# Patient Record
Sex: Male | Born: 1985 | Race: Black or African American | Hispanic: No | Marital: Single | Smoking: Former smoker
Health system: Southern US, Community
[De-identification: ages and names within clinical notes are randomized; demographics above are authoritative.]

## PROBLEM LIST (undated history)

## (undated) DIAGNOSIS — J45909 Unspecified asthma, uncomplicated: Secondary | ICD-10-CM

## (undated) HISTORY — PX: TONSILLECTOMY: SUR1361

## (undated) HISTORY — PX: EXPLORATORY LAPAROTOMY: SUR591

---
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
## 2007-08-22 ENCOUNTER — Inpatient Hospital Stay (HOSPITAL_COMMUNITY): Admission: AC | Admit: 2007-08-22 | Discharge: 2007-08-25 | Payer: Self-pay

## 2009-10-05 ENCOUNTER — Emergency Department (HOSPITAL_COMMUNITY): Admission: EM | Admit: 2009-10-05 | Discharge: 2009-10-05 | Payer: Self-pay | Admitting: Emergency Medicine

## 2009-12-01 ENCOUNTER — Emergency Department (HOSPITAL_COMMUNITY): Admission: EM | Admit: 2009-12-01 | Discharge: 2009-12-01 | Payer: Self-pay | Admitting: Emergency Medicine

## 2010-03-15 ENCOUNTER — Emergency Department (HOSPITAL_COMMUNITY): Admission: EM | Admit: 2010-03-15 | Discharge: 2010-03-15 | Payer: Self-pay | Admitting: Emergency Medicine

## 2010-09-22 ENCOUNTER — Emergency Department (HOSPITAL_COMMUNITY)
Admission: EM | Admit: 2010-09-22 | Discharge: 2010-09-22 | Payer: Self-pay | Source: Home / Self Care | Admitting: Emergency Medicine
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
## 2011-07-08 LAB — CBC
HCT: 40.1
Hemoglobin: 13.5
MCHC: 33.7
MCHC: 34
MCV: 92.3
MCV: 92.4
Platelets: 319
RBC: 4.35
RDW: 13.4
WBC: 11.5 — ABNORMAL HIGH
WBC: 8.8

## 2011-07-08 LAB — BASIC METABOLIC PANEL
BUN: 4 — ABNORMAL LOW
BUN: 9
CO2: 27
Calcium: 8.7
Chloride: 105
GFR calc Af Amer: >60
GFR calc Af Amer: >60
GFR calc non Af Amer: >60
Potassium: 3.6
Sodium: 136
Sodium: 137

## 2011-07-08 LAB — I-STAT 8, (EC8 V) (CONVERTED LAB)
Acid-base deficit: 1
BUN: 13
Chloride: 105
Glucose, Bld: 104 — ABNORMAL HIGH
HCT: 43
Potassium: 2.9 — ABNORMAL LOW

## 2011-07-08 LAB — HEMOGLOBIN AND HEMATOCRIT, BLOOD
HCT: 37 — ABNORMAL LOW
Hemoglobin: 12.5 — ABNORMAL LOW

## 2011-07-08 LAB — TYPE AND SCREEN: Antibody Screen: NEGATIVE

## 2011-07-08 LAB — POCT I-STAT CREATININE: Operator id: 151321

## 2011-07-08 LAB — ABO/RH: ABO/RH(D): A NEG

## 2011-07-08 LAB — PROTIME-INR
INR: 1.2
Prothrombin Time: 15.2

## 2012-04-14 DIAGNOSIS — R197 Diarrhea, unspecified: Secondary | ICD-10-CM | POA: Insufficient documentation

## 2012-04-14 DIAGNOSIS — R112 Nausea with vomiting, unspecified: Secondary | ICD-10-CM | POA: Insufficient documentation

## 2012-04-15 ENCOUNTER — Emergency Department (HOSPITAL_COMMUNITY)
Admission: EM | Admit: 2012-04-15 | Discharge: 2012-04-15 | Disposition: A | Discharge status: Home / Self Care | Payer: Self-pay | Attending: Emergency Medicine | Admitting: Emergency Medicine

## 2012-04-15 ENCOUNTER — Encounter (HOSPITAL_COMMUNITY): Payer: Self-pay | Admitting: *Deleted

## 2012-04-15 DIAGNOSIS — R197 Diarrhea, unspecified: Secondary | ICD-10-CM

## 2012-04-15 LAB — POCT I-STAT, CHEM 8
BUN: 13 mg/dL (ref 6–23)
Calcium, Ion: 1.2 mmol/L (ref 1.12–1.23)
Chloride: 106 mEq/L (ref 96–112)
Creatinine, Ser: 1 mg/dL (ref 0.50–1.35)
Glucose, Bld: 96 mg/dL (ref 70–99)
HCT: 44 % (ref 39.0–52.0)
Hemoglobin: 15 g/dL (ref 13.0–17.0)
Potassium: 4.7 mEq/L (ref 3.5–5.1)
Sodium: 143 mEq/L (ref 135–145)
TCO2: 24 mmol/L (ref 0–100)

## 2012-04-15 MED ORDER — ONDANSETRON HCL 4 MG/2ML IJ SOLN
4.0000 mg | Freq: Once | INTRAMUSCULAR | Status: AC
  Administered 2012-04-15: 4 mg via INTRAVENOUS

## 2012-04-15 MED ORDER — SODIUM CHLORIDE 0.9 % IV BOLUS (SEPSIS)
1000.0000 mL | Freq: Once | INTRAVENOUS | Status: AC
  Administered 2012-04-15: 1000 mL via INTRAVENOUS

## 2012-04-15 MED ORDER — ONDANSETRON HCL 4 MG/2ML IJ SOLN
INTRAMUSCULAR | Status: AC
  Filled 2012-04-15: qty 2

## 2012-04-15 MED ORDER — PROMETHAZINE HCL 25 MG PO TABS: 25.0000 mg | ORAL_TABLET | Freq: Four times a day (QID) | ORAL | Status: DC | PRN

## 2012-04-15 NOTE — ED Notes (Signed)
Patient states that he ate [employer] hot cakes and after wards he started experiencing nausea,vomiting, and chills.
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
## 2012-11-02 ENCOUNTER — Emergency Department (HOSPITAL_COMMUNITY)
Admission: EM | Admit: 2012-11-02 | Discharge: 2012-11-02 | Disposition: A | Discharge status: Home / Self Care | Payer: Self-pay | Attending: Emergency Medicine | Admitting: Emergency Medicine

## 2012-11-02 ENCOUNTER — Encounter (HOSPITAL_COMMUNITY): Payer: Self-pay | Admitting: *Deleted

## 2012-11-02 DIAGNOSIS — R300 Dysuria: Secondary | ICD-10-CM | POA: Insufficient documentation

## 2012-11-02 DIAGNOSIS — R369 Urethral discharge, unspecified: Secondary | ICD-10-CM | POA: Insufficient documentation

## 2012-11-02 LAB — URINALYSIS, ROUTINE W REFLEX MICROSCOPIC
Glucose, UA: NEGATIVE mg/dL
Hgb urine dipstick: NEGATIVE
Ketones, ur: NEGATIVE mg/dL
Nitrite: NEGATIVE
Protein, ur: NEGATIVE mg/dL
Specific Gravity, Urine: 1.029 (ref 1.005–1.030)
Urobilinogen, UA: 1 mg/dL (ref 0.0–1.0)
pH: 7 (ref 5.0–8.0)

## 2012-11-02 LAB — URINE MICROSCOPIC-ADD ON

## 2012-11-02 MED ORDER — AZITHROMYCIN 250 MG PO TABS
1000.0000 mg | ORAL_TABLET | Freq: Once | ORAL | Status: AC
  Administered 2012-11-02: 1000 mg via ORAL
  Filled 2012-11-02: qty 4

## 2012-11-02 MED ORDER — CEFTRIAXONE SODIUM 250 MG IJ SOLR
250.0000 mg | Freq: Once | INTRAMUSCULAR | Status: AC
  Administered 2012-11-02: 250 mg via INTRAMUSCULAR
  Filled 2012-11-02: qty 250

## 2012-11-02 MED ORDER — LIDOCAINE HCL (PF) 1 % IJ SOLN
INTRAMUSCULAR | Status: AC
  Administered 2012-11-02: 1.2 mL
  Filled 2012-11-02: qty 5

## 2012-11-02 NOTE — ED Notes (Signed)
Pt states unable to void for the past couple of days. Pt states that he is having discharge, and painful urination. Pt states no new partners. Pt denies N/V.

## 2012-11-03 LAB — URINE CULTURE
Colony Count: NO GROWTH
Culture: NO GROWTH

## 2012-11-03 NOTE — ED Provider Notes (Signed)
Medical screening examination/treatment/procedure(s) were performed by non-physician practitioner and as supervising physician I was immediately available for consultation/collaboration.  Sunnie Nielsen, MD 11/03/12 [phone]

## 2012-11-03 NOTE — ED Provider Notes (Signed)
History     CSN: [id number]  Arrival date & time 11/02/12  1941   First MD Initiated Contact with Patient 11/02/12 2009      Chief Complaint  Patient presents with  . Dysuria    (Consider location/radiation/quality/duration/timing/severity/associated sxs/prior treatment) HPI History provided by pt.   Pt c/o dysuria for the past day.  Associated w/ urethral discharge.  Denies fever, abdominal/low back/testicular pain, genitalia rash and other urinary sx.  H/o chlamydia and it presented similarly.  Had unprotected sex 3 days ago.  Has never had a UTI.  History reviewed. No pertinent past medical history.  History reviewed. No pertinent past surgical history.  History reviewed. No pertinent family history.  History  Substance Use Topics  . Smoking status: Passive Smoke Exposure - Never Smoker    Types: Cigarettes  . Smokeless tobacco: Not on file  . Alcohol Use: No      Review of Systems  All other systems reviewed and are negative.    Allergies  Review of patient's allergies indicates no known allergies.  Home Medications   Current Outpatient Rx  Name  Route  Sig  Dispense  Refill  . PROMETHAZINE HCL 25 MG PO TABS   Oral   Take 1 tablet (25 mg total) by mouth every 6 (six) hours as needed for nausea.   30 tablet   0     BP 113/64  Pulse 51  Temp 98.4 F (36.9 C) (Oral)  Resp 16  SpO2 99%  Physical Exam  Nursing note and vitals reviewed. Constitutional: He is oriented to person, place, and time. He appears well-developed and well-nourished. No distress.  HENT:  Head: Normocephalic and atraumatic.  Eyes:       Normal appearance  Neck: Normal range of motion.  Cardiovascular: Normal rate and regular rhythm.   Pulmonary/Chest: Effort normal and breath sounds normal. No respiratory distress.  Abdominal: Soft. Bowel sounds are normal. He exhibits no distension and no mass. There is no tenderness. There is no rebound and no guarding.  Genitourinary:   No CVA tenderness.  No genitalia rash.  Thin, white penile discharge.  Testicles descended bilaterally.  No masses.  No tenderness.   No inguinal lymphadenopathy.  Musculoskeletal: Normal range of motion.  Neurological: He is alert and oriented to person, place, and time.  Skin: Skin is warm and dry. No rash noted.  Psychiatric: He has a normal mood and affect. His behavior is normal.    ED Course  Procedures (including critical care time)  Labs Reviewed  URINALYSIS, ROUTINE W REFLEX MICROSCOPIC - Abnormal; Notable for the following:    APPearance CLOUDY (*)     Leukocytes, UA LARGE (*)     All other components within normal limits  URINE MICROSCOPIC-ADD ON  URINE CULTURE   No results found.   1. Urethral discharge       MDM  26yo M presents w/ urethral discharge and dysuria since yesterday.  Screened for GG/Chlam and pt received both rocephin and zithromax.  Doubt cystitis because no other urinary sx nor prior history of UTI.  Recommended f/u with [facility] clinic and Return precautions discussed.         Otilio Miu, PA-C 11/03/12 [phone]

## 2013-07-21 ENCOUNTER — Ambulatory Visit (HOSPITAL_COMMUNITY)
Admission: AD | Admit: 2013-07-21 | Discharge: 2013-07-21 | DRG: 134 | Disposition: A | Discharge status: Home / Self Care | Payer: Self-pay | Source: Ambulatory Visit | Attending: Otolaryngology | Admitting: Otolaryngology

## 2013-07-21 ENCOUNTER — Encounter (HOSPITAL_COMMUNITY): Payer: Self-pay | Admitting: Anesthesiology

## 2013-07-21 ENCOUNTER — Encounter (HOSPITAL_COMMUNITY): Payer: Self-pay | Admitting: Emergency Medicine

## 2013-07-21 ENCOUNTER — Inpatient Hospital Stay (HOSPITAL_COMMUNITY): Payer: Self-pay | Admitting: Anesthesiology

## 2013-07-21 ENCOUNTER — Encounter (HOSPITAL_COMMUNITY)
Admission: AD | Disposition: A | Discharge status: Home / Self Care | Payer: Self-pay | Source: Ambulatory Visit | Attending: Otolaryngology

## 2013-07-21 ENCOUNTER — Emergency Department (HOSPITAL_COMMUNITY): Payer: Self-pay

## 2013-07-21 ENCOUNTER — Emergency Department (HOSPITAL_COMMUNITY)
Admission: EM | Admit: 2013-07-21 | Discharge: 2013-07-21 | Disposition: A | Discharge status: Home / Self Care | Payer: Self-pay | Attending: Emergency Medicine | Admitting: Emergency Medicine

## 2013-07-21 DIAGNOSIS — J020 Streptococcal pharyngitis: Secondary | ICD-10-CM | POA: Insufficient documentation

## 2013-07-21 DIAGNOSIS — J329 Chronic sinusitis, unspecified: Secondary | ICD-10-CM | POA: Diagnosis present

## 2013-07-21 DIAGNOSIS — J36 Peritonsillar abscess: Secondary | ICD-10-CM | POA: Diagnosis present

## 2013-07-21 DIAGNOSIS — J3503 Chronic tonsillitis and adenoiditis: Secondary | ICD-10-CM | POA: Diagnosis present

## 2013-07-21 HISTORY — PX: TONSILLECTOMY: SHX5217

## 2013-07-21 LAB — BASIC METABOLIC PANEL
CO2: 25 mEq/L (ref 19–32)
Chloride: 98 mEq/L (ref 96–112)
Creatinine, Ser: 1.02 mg/dL (ref 0.50–1.35)
Glucose, Bld: 96 mg/dL (ref 70–99)

## 2013-07-21 LAB — CBC WITH DIFFERENTIAL/PLATELET
Basophils Absolute: 0 10*3/uL (ref 0.0–0.1)
Eosinophils Relative: 1 % (ref 0–5)
HCT: 43.4 % (ref 39.0–52.0)
Hemoglobin: 14.9 g/dL (ref 13.0–17.0)
Lymphocytes Relative: 11 % — ABNORMAL LOW (ref 12–46)
Lymphs Abs: 1.4 10*3/uL (ref 0.7–4.0)
MCV: 91.4 fL (ref 78.0–100.0)
Monocytes Absolute: 1.3 10*3/uL — ABNORMAL HIGH (ref 0.1–1.0)
Neutro Abs: 10.5 10*3/uL — ABNORMAL HIGH (ref 1.7–7.7)
RBC: 4.75 MIL/uL (ref 4.22–5.81)
RDW: 13.2 % (ref 11.5–15.5)
WBC: 13.4 10*3/uL — ABNORMAL HIGH (ref 4.0–10.5)

## 2013-07-21 LAB — RAPID STREP SCREEN (MED CTR MEBANE ONLY): Streptococcus, Group A Screen (Direct): POSITIVE — AB

## 2013-07-21 SURGERY — TONSILLECTOMY
Anesthesia: General | Site: Throat | Laterality: Bilateral | Wound class: Dirty or Infected

## 2013-07-21 MED ORDER — MIDAZOLAM HCL 5 MG/5ML IJ SOLN
INTRAMUSCULAR | Status: DC | PRN
  Administered 2013-07-21: 1 mg via INTRAVENOUS

## 2013-07-21 MED ORDER — MORPHINE SULFATE 2 MG/ML IJ SOLN
2.0000 mg | Freq: Once | INTRAMUSCULAR | Status: AC
  Administered 2013-07-21: 2 mg via INTRAVENOUS
  Filled 2013-07-21: qty 1

## 2013-07-21 MED ORDER — GLYCOPYRROLATE 0.2 MG/ML IJ SOLN
INTRAMUSCULAR | Status: DC | PRN
  Administered 2013-07-21: 0.2 mg via INTRAVENOUS
  Administered 2013-07-21: .6 mg via INTRAVENOUS

## 2013-07-21 MED ORDER — ARTIFICIAL TEARS OP OINT
TOPICAL_OINTMENT | OPHTHALMIC | Status: DC | PRN
  Administered 2013-07-21: 1 via OPHTHALMIC

## 2013-07-21 MED ORDER — PROPOFOL 10 MG/ML IV BOLUS
INTRAVENOUS | Status: DC | PRN
  Administered 2013-07-21: 200 mg via INTRAVENOUS

## 2013-07-21 MED ORDER — SUCCINYLCHOLINE CHLORIDE 20 MG/ML IJ SOLN
INTRAMUSCULAR | Status: DC | PRN
  Administered 2013-07-21: 100 mg via INTRAVENOUS

## 2013-07-21 MED ORDER — ONDANSETRON HCL 4 MG/2ML IJ SOLN
INTRAMUSCULAR | Status: DC | PRN
  Administered 2013-07-21: 4 mg via INTRAVENOUS

## 2013-07-21 MED ORDER — OXYMETAZOLINE HCL 0.05 % NA SOLN
NASAL | Status: AC
  Filled 2013-07-21: qty 15

## 2013-07-21 MED ORDER — IOHEXOL 300 MG/ML  SOLN
75.0000 mL | Freq: Once | INTRAMUSCULAR | Status: AC | PRN
  Administered 2013-07-21: 75 mL via INTRAVENOUS

## 2013-07-21 MED ORDER — LIDOCAINE HCL (CARDIAC) 20 MG/ML IV SOLN
INTRAVENOUS | Status: DC | PRN
  Administered 2013-07-21: 80 mg via INTRAVENOUS

## 2013-07-21 MED ORDER — DEXAMETHASONE SODIUM PHOSPHATE 4 MG/ML IJ SOLN
INTRAMUSCULAR | Status: DC | PRN
  Administered 2013-07-21: 10 mg via INTRAVENOUS

## 2013-07-21 MED ORDER — CLINDAMYCIN PHOSPHATE 600 MG/50ML IV SOLN
600.0000 mg | Freq: Once | INTRAVENOUS | Status: AC
  Administered 2013-07-21: 600 mg via INTRAVENOUS
  Filled 2013-07-21: qty 50

## 2013-07-21 MED ORDER — FENTANYL CITRATE 0.05 MG/ML IJ SOLN
INTRAMUSCULAR | Status: DC | PRN
  Administered 2013-07-21: 100 ug via INTRAVENOUS

## 2013-07-21 MED ORDER — LACTATED RINGERS IV SOLN
INTRAVENOUS | Status: DC | PRN
  Administered 2013-07-21 (×2): via INTRAVENOUS

## 2013-07-21 MED ORDER — CLINDAMYCIN PHOSPHATE 600 MG/50ML IV SOLN
INTRAVENOUS | Status: AC
  Administered 2013-07-21: 600 mg via INTRAVENOUS
  Filled 2013-07-21: qty 50

## 2013-07-21 MED ORDER — 0.9 % SODIUM CHLORIDE (POUR BTL) OPTIME
TOPICAL | Status: DC | PRN
  Administered 2013-07-21: 1000 mL

## 2013-07-21 MED ORDER — LACTATED RINGERS IV SOLN
INTRAVENOUS | Status: DC
  Administered 2013-07-21: 16:00:00 via INTRAVENOUS

## 2013-07-21 MED ORDER — NEOSTIGMINE METHYLSULFATE 1 MG/ML IJ SOLN
INTRAMUSCULAR | Status: DC | PRN
  Administered 2013-07-21: 4 mg via INTRAVENOUS

## 2013-07-21 MED ORDER — ROCURONIUM BROMIDE 100 MG/10ML IV SOLN
INTRAVENOUS | Status: DC | PRN
  Administered 2013-07-21: 20 mg via INTRAVENOUS

## 2013-07-21 MED ORDER — OXYMETAZOLINE HCL 0.05 % NA SOLN
NASAL | Status: DC | PRN
  Administered 2013-07-21: 1

## 2013-07-21 SURGICAL SUPPLY — 35 items
BLADE SURG 15 STRL LF DISP TIS (BLADE) IMPLANT
BLADE SURG 15 STRL SS (BLADE) ×2
CANISTER SUCTION 2500CC (MISCELLANEOUS) ×2 IMPLANT
CATH ROBINSON RED A/P 10FR (CATHETERS) ×2 IMPLANT
CLEANER TIP ELECTROSURG 2X2 (MISCELLANEOUS) ×2 IMPLANT
CLOTH BEACON ORANGE TIMEOUT ST (SAFETY) ×1 IMPLANT
COAGULATOR SUCT SWTCH 10FR 6 (ELECTROSURGICAL) ×2 IMPLANT
CONT SPEC STER OR (MISCELLANEOUS) ×2 IMPLANT
ELECT COATED BLADE 2.86 ST (ELECTRODE) ×2 IMPLANT
ELECT REM PT RETURN 9FT ADLT (ELECTROSURGICAL) ×2
ELECT REM PT RETURN 9FT PED (ELECTROSURGICAL)
ELECTRODE REM PT RETRN 9FT PED (ELECTROSURGICAL) IMPLANT
ELECTRODE REM PT RTRN 9FT ADLT (ELECTROSURGICAL) IMPLANT
GAUZE SPONGE 4X4 16PLY XRAY LF (GAUZE/BANDAGES/DRESSINGS) ×2 IMPLANT
GLOVE BIOGEL PI IND STRL 6.5 (GLOVE) IMPLANT
GLOVE BIOGEL PI INDICATOR 6.5 (GLOVE) ×1
GLOVE SURG SS PI 6.0 STRL IVOR (GLOVE) ×2 IMPLANT
GLOVE SURG SS PI 7.5 STRL IVOR (GLOVE) ×2 IMPLANT
GOWN STRL NON-REIN LRG LVL3 (GOWN DISPOSABLE) ×3 IMPLANT
KIT BASIN OR (CUSTOM PROCEDURE TRAY) ×2 IMPLANT
KIT ROOM TURNOVER OR (KITS) ×2 IMPLANT
NS IRRIG 1000ML POUR BTL (IV SOLUTION) ×2 IMPLANT
PACK SURGICAL SETUP 50X90 (CUSTOM PROCEDURE TRAY) ×2 IMPLANT
PAD ARMBOARD 7.5X6 YLW CONV (MISCELLANEOUS) ×4 IMPLANT
PENCIL BUTTON HOLSTER BLD 10FT (ELECTRODE) ×2 IMPLANT
SPECIMEN JAR SMALL (MISCELLANEOUS) IMPLANT
SPONGE TONSIL 1 RF SGL (DISPOSABLE) ×1 IMPLANT
SWAB COLLECTION DEVICE MRSA (MISCELLANEOUS) ×1 IMPLANT
SYR BULB 3OZ (MISCELLANEOUS) ×2 IMPLANT
TOWEL OR 17X24 6PK STRL BLUE (TOWEL DISPOSABLE) ×4 IMPLANT
TUBE ANAEROBIC SPECIMEN COL (MISCELLANEOUS) ×1 IMPLANT
TUBE CONNECTING 12X1/4 (SUCTIONS) ×2 IMPLANT
TUBE SALEM SUMP 12R W/ARV (TUBING) IMPLANT
WATER STERILE IRR 1000ML POUR (IV SOLUTION) IMPLANT
YANKAUER SUCT BULB TIP NO VENT (SUCTIONS) ×3 IMPLANT

## 2013-07-21 NOTE — Anesthesia Preprocedure Evaluation (Addendum)
Anesthesia Evaluation  Patient identified by MRN, date of birth, ID band Patient awake    Reviewed: Allergy & Precautions, H&P , NPO status , Patient's Chart, lab work & pertinent test results  Airway Mallampati: III TM Distance: >3 FB Neck ROM: Full  Mouth opening: Limited Mouth Opening  Dental  (+) Teeth Intact   Pulmonary Current Smoker,    Pulmonary exam normal       Cardiovascular     Neuro/Psych    GI/Hepatic Neg liver ROS,   Endo/Other  negative endocrine ROS  Renal/GU negative Renal ROS     Musculoskeletal   Abdominal Normal abdominal exam  (+)   Peds  Hematology   Anesthesia Other Findings   Reproductive/Obstetrics                          Anesthesia Physical Anesthesia Plan  ASA: II  Anesthesia Plan: General   Post-op Pain Management:    Induction: Intravenous  Airway Management Planned: Oral ETT  Additional Equipment:   Intra-op Plan:   Post-operative Plan: Extubation in OR  Informed Consent: I have reviewed the patients History and Physical, chart, labs and discussed the procedure including the risks, benefits and alternatives for the proposed anesthesia with the patient or authorized representative who has indicated his/her understanding and acceptance.   Dental advisory given  Plan Discussed with: CRNA, Anesthesiologist and Surgeon  Anesthesia Plan Comments:         Anesthesia Quick Evaluation

## 2013-07-21 NOTE — OR Nursing (Signed)
Pt's throat assessed. Operative area w/o complications on arrival & before tx to phase II.

## 2013-07-21 NOTE — ED Notes (Signed)
Reports having a sore throat for 2-3 days and having chills/fever. Airway intact.

## 2013-07-21 NOTE — Transfer of Care (Signed)
Immediate Anesthesia Transfer of Care Note  Patient: [name] Harris  Procedure(s) Performed: Procedure(s): TONSILLECTOMY (Bilateral)  Patient Location: PACU  Anesthesia Type:General  Level of Consciousness: awake, alert , oriented and patient cooperative  Airway & Oxygen Therapy: Patient Spontanous Breathing and Patient connected to nasal cannula oxygen  Post-op Assessment: Report given to PACU RN, Post -op Vital signs reviewed and stable and Patient moving all extremities X 4  Post vital signs: Reviewed and stable  Complications: No apparent anesthesia complications

## 2013-07-21 NOTE — H&P (Signed)
07/21/2013  [name] Harris  PREOPERATIVE HISTORY AND PHYSICAL  CHIEF COMPLAINT: left peritonsillar abscess with chronic tonsillitis and tonsillar hypertrophy  HISTORY: This is a 27 year old who presents with left peritonsillar abscess with chronic tonsillitis and tonsillar hypertrophy.  He declined office incision and drainage and now presents for bilateral tonsillectomy.  Dr. Emeline Darling, [place] Riley has discussed the risks (bleeding, infection, risks of general anesthesia, tongue injury or numbness, etc.), benefits, and alternatives of this procedure. The patient understands the risks and would like to proceed with the procedure. The chances of success of the procedure are >75% and the patient understands this. I personally performed an examination of the patient within 24 hours of the procedure.  PAST MEDICAL HISTORY: No past medical history on file.  PAST SURGICAL HISTORY: No past surgical history on file.  MEDICATIONS: No current facility-administered medications on file prior to encounter.   No current outpatient prescriptions on file prior to encounter.    ALLERGIES: No Known Allergies  SOCIAL HISTORY: History   Social History  . Marital Status: Single    Spouse Name: N/A    Number of Children: N/A  . Years of Education: N/A   Occupational History  . Not on file.   Social History Main Topics  . Smoking status: Passive Smoke Exposure - Never Smoker    Types: Cigarettes  . Smokeless tobacco: Not on file  . Alcohol Use: No  . Drug Use: Yes    Special: Marijuana  . Sexual Activity:    Other Topics Concern  . Not on file   Social History Narrative  . No narrative on file    FAMILY HISTORY: No family history on file.  REVIEW OF SYSTEMS:  HEENT: +L>R throat pain and swelling, dysphagia, odynophagia, left ear pain, otherwise negative x 10 systems except per HPI  PHYSICAL EXAM:  GENERAL:  Appears uncomfortable VITAL SIGNS:  There were no vitals filed for this  visit. SKIN:  Warm, dry HEENT:  mallampatti 2 with uvula deviated to the right with 3+ cryptic tonsils and erythema/L>R palatal edema consistent with tonsillitis with left peritonsillar abscess NECK:  Trachea midline LYMPH:  Scattered reactive LAD LUNGS:  Grossly clear CARDIOVASCULAR:  RRR ABDOMEN:  Soft, NT MUSCULOSKELETAL: normal strength PSYCH:  Normal affect NEUROLOGIC:  CN 2-12 grossly intact and symmetric  DIAGNOSTIC STUDIES:CT scan shows tonsillar hypertrophy and left 1cm peritonsillar abscess.  ASSESSMENT AND PLAN: Plan to proceed with bilateral tonsillectomy. Patient understands the risks, benefits, and alternatives. Informed written consent obtained by Dr. Emeline Darling. Prescriptions for clindamycin, roxicet, prednisolone taper, and zofran given to patient in office. 07/21/2013  3:36 PM [name] Harris

## 2013-07-21 NOTE — Preoperative (Signed)
Beta Blockers   Reason not to administer Beta Blockers:Not Applicable 

## 2013-07-21 NOTE — Op Note (Signed)
DATE OF OPERATION: 07/21/2013 Surgeon: Melvenia Beam Procedure Performed: 16109 bilateral tonsillectomy greater than 25 years old  PREOPERATIVE DIAGNOSIS: adenotonsillar hypertrophy with acute on chronic  Adenotonsillitis, sinusitis, and a left peritonsillar abscess  POSTOPERATIVE DIAGNOSIS: adenotonsillar hypertrophy with acute on chronic  Adenotonsillitis, sinusitis, and a left peritonsillar abscess SURGEON: Melvenia Beam ANESTHESIA: General endotracheal.  ESTIMATED BLOOD LOSS: less than 25 mL.  DRAINS: none SPECIMENS: tonsils and left peritonsillar abscess culture INDICATIONS: The patient is a 27yo with a history of adenotonsillar hypertrophy with acute on chronic  Adenotonsillitis, sinusitis, and a left peritonsillar abscess  DESCRIPTION OF OPERATION: The patient was brought to the operating room and was placed in the supine position and was placed under general endotracheal anesthesia by anesthesiology. The bed was turned 90 degrees and the Crowe-Davis mouth retractor was placed over the endotracheal tube and suspended from the Mayo stand. The palate was inspected and palpated and noted to be intact with no submucous cleft. The uvula was edematous and deviated to the right. The right anterior tonsillar pillar was edematous/erythematous consistent with right peritonsillar abscess. The adenoids were inspected with a dental mirror and noted to be hypertrophic with copious purulence on the adenoids and in the right nasopharynx/nasal cavity. The purulence was irrigated out and suctioned and then the adenoids were ablated using the suction Bovie taking care to leave a ridge of adenoid tissue inferiorly near the palate to prevent velopharyngeal insufficiency.  Next the right tonsil was grasped with a curved Allis clamp and dissected from the right tonsillar fossa using the Bovie. Multiple small pus pockets were encountered and opened and suctioned out. Meticulous hemostasis was then achieved. The left  tonsil was then grasped with the curved Allis and dissected from the left tonsillar fossa using the Bovie. A large left peritonsillar abscess pocket was encountered and widely opened, irrigated out, and suctioned out. Meticulous hemostasis was achieved. The nasal cavity and oropharynx were irrigated out and then the the nose, oral cavity,  and stomach were suctioned out. The patient was turned back to anesthesia and awakened from anesthesia and extubated without difficulty. The patient tolerated the procedure well with no immediate complications and was taken to the postoperative recovery area in good condition.   Dr. Melvenia Beam was present and performed the entire procedure. 07/21/2013  5:27 PM Melvenia Beam

## 2013-07-21 NOTE — Anesthesia Postprocedure Evaluation (Signed)
  Anesthesia Post-op Note  Patient: [name] Harris  Procedure(s) Performed: Procedure(s): TONSILLECTOMY (Bilateral)   Patient Location: PACU  Anesthesia Type:General  Level of Consciousness: awake  Airway and Oxygen Therapy: Patient Spontanous Breathing  Post-op Pain: mild  Post-op Assessment: Post-op Vital signs reviewed  Post-op Vital Signs: Reviewed  Complications: No apparent anesthesia complications

## 2013-07-21 NOTE — ED Provider Notes (Signed)
CSN: [id number]     Arrival date & time 07/21/13  1144 History   First MD Initiated Contact with Patient 07/21/13 1156     Chief Complaint  Patient presents with  . Sore Throat   (Consider location/radiation/quality/duration/timing/severity/associated sxs/prior Treatment) HPI Comments: The patient is a 27 year old male who presents with a 2 days history of worsening sore throat.  He reports subjective fever for 2 days with chills.  Reports pain increases with swallowing. Reports mild Left ear pain.  Pain was not relieved by OTC numbing medication. No known sick contact. Denies cough, rhinorrhea, nausea, vomiting, abdominal pain, diarrhea or constipation.   The history is provided by the patient.    History reviewed. No pertinent past medical history. Past Surgical History  Procedure Laterality Date  . Exploratory laparotomy    . Tonsillectomy    . Tonsillectomy Bilateral 07/21/2013    Procedure: TONSILLECTOMY;  Surgeon: Melvenia Beam, MD;  Location: [facility] OR;  Service: ENT;  Laterality: Bilateral;   History reviewed. No pertinent family history. History  Substance Use Topics  . Smoking status: Passive Smoke Exposure - Never Smoker    Types: Cigarettes  . Smokeless tobacco: Not on file  . Alcohol Use: No    Review of Systems  All other systems reviewed and are negative.    Allergies  Review of patient's allergies indicates no known allergies.  Home Medications   Current Outpatient Rx  Name  Route  Sig  Dispense  Refill  . phenol (CHLORASEPTIC) 1.4 % LIQD   Mouth/Throat   Use as directed 1 spray in the mouth or throat as needed (for sore throat).          BP 116/71  Pulse 73  Temp(Src) 99.3 F (37.4 C) (Oral)  Resp 19  Wt 164 lb (74.39 kg)  SpO2 97% Physical Exam  Nursing note and vitals reviewed. Constitutional: He is oriented to person, place, and time. He appears well-developed and well-nourished. No distress.  HENT:  Head: Normocephalic and atraumatic.   Right Ear: Tympanic membrane normal. Tympanic membrane is not injected and not bulging.  Left Ear: Tympanic membrane normal. Tympanic membrane is not injected and not bulging.  Nose: Nose normal. No rhinorrhea.  Mouth/Throat: Uvula is midline. Mucous membranes are not pale and not dry. No trismus in the jaw. No uvula swelling. Oropharyngeal exudate, posterior oropharyngeal edema and posterior oropharyngeal erythema present.  Tonsils: 4+ Left, 3+ Right.  Left >Right peritonsillar erythema  Neck: Normal range of motion. Neck supple.  Cardiovascular: Normal rate, regular rhythm and normal heart sounds.   Pulmonary/Chest: Effort normal and breath sounds normal. He has no wheezes. He has no rales.  Abdominal: Soft. Bowel sounds are normal. He exhibits no distension. There is no tenderness. There is no rebound.  Musculoskeletal: He exhibits no edema.  Lymphadenopathy:       Head (right side): Tonsillar adenopathy present.       Head (left side): Tonsillar adenopathy present.  Neurological: He is alert and oriented to person, place, and time.  Skin: Skin is warm and dry. No rash noted.    ED Course  Procedures (including critical care time) Labs Review Labs Reviewed  RAPID STREP SCREEN - Abnormal; Notable for the following:    Streptococcus, Group A Screen (Direct) POSITIVE (*)    All other components within normal limits  CBC WITH DIFFERENTIAL - Abnormal; Notable for the following:    WBC 13.4 (*)    Neutrophils Relative % 79 (*)  Neutro Abs 10.5 (*)    Lymphocytes Relative 11 (*)    Monocytes Absolute 1.3 (*)    All other components within normal limits  BASIC METABOLIC PANEL   Imaging Review No results found. CLINICAL DATA: 27 year old male with sore throat. Fever chills.  Left peritonsillar abscess suspected.  EXAM:  CT NECK WITH CONTRAST  TECHNIQUE:  Multidetector CT imaging of the neck was performed using the  standard protocol following the bolus administration of  intravenous  contrast.  CONTRAST: 75mL OMNIPAQUE IOHEXOL 300 MG/ML SOLN  COMPARISON: None.  FINDINGS:  Negative lung apices and superior mediastinum. No osseous  abnormality in the visible thorax. Small right greater than left C7  cervical ribs.  Thyroid, larynx, hypopharynx, sublingual space, submandibular  glands, parotid glands, visualized brain parenchyma and orbits soft  tissues are within normal limits.  Major vascular structures in the neck and at the skullbase remain  patent, including the internal jugular veins.  Enlarged and hyperenhancing tonsillar pillars and adenoids. The left  tonsil is larger, and situated within the left tonsil there is a 14  x 15 x 16 mm hypodense area with earlier rim enhancement (series 2,  image 38 and coronal image 40). The parapharyngeal spaces are  normal. Evidence of trace fluid trapped within the hypertrophied  adenoids. There is retropharyngeal effusion on the left which tracks  caudally to the level of the left piriform sinus. Reactive cervical  lymph nodes, the largest are on the left at level 2 measuring up to  16 mm in short axis. Reactive retropharyngeal nodes measuring up to  10 mm short axis. Mild hypertrophy of the lingual tonsil.  Fluid levels and bubbly opacity in the left sphenoid and right  maxillary sinuses. Minor paranasal sinus mucosal thickening  elsewhere. Mastoids and tympanic cavities are clear. No acute  osseous abnormality identified.  IMPRESSION:  1. Acute tonsillitis with left tonsillar abscess measuring 15 mm  diameter (series 2, image 38). Reactive retropharyngeal effusion on  the left tracking from the tonsil to the piriform sinus.  2. Reactive retropharyngeal and cervical lymphadenopathy, maximal at  the left level 2 station.  3. Paranasal sinus inflammatory changes compatible with acute  sinusitis.  Electronically Signed  By: Augusto Gamble M.D.  On: 07/21/2013 13:26   EKG Interpretation   None       MDM    1. Tonsil, abscess   2. Strep pharyngitis    Patient without a significant past medical history presents with a 2 days history of sore throat and subjective fever.  Exam reveals L>R tonsil concerning for possible abscess.  Discussed patient condition with Dr. Silverio Lay and will start  Clindamycin in the ED and CT ordered.  CT IMPRESSION:  1. Acute tonsillitis with left tonsillar abscess measuring 15 mm  diameter (series 2, image 38). Reactive retropharyngeal effusion on  the left tracking from the tonsil to the piriform sinus.  2. Reactive retropharyngeal and cervical lymphadenopathy, maximal at  the left level 2 station.  3. Paranasal sinus inflammatory changes compatible with acute  sinusitis.   1335 Patient resting comfortably in bed.  Discussed CT results with patient and are consulting ENT for treatment plan.   Dr. Emeline Darling requesting patient to be evaluated at their clinic today.  Patient understands directions to report to  Dr. Ellyn Hack office immediately.   Clabe Seal, PA-C 07/23/13 [phone]

## 2013-07-22 ENCOUNTER — Emergency Department (HOSPITAL_COMMUNITY)
Admission: EM | Admit: 2013-07-22 | Discharge: 2013-07-22 | Disposition: A | Discharge status: Home / Self Care | Payer: Self-pay | Attending: Emergency Medicine | Admitting: Emergency Medicine

## 2013-07-22 ENCOUNTER — Encounter (HOSPITAL_COMMUNITY): Payer: Self-pay | Admitting: Emergency Medicine

## 2013-07-22 DIAGNOSIS — Y849 Medical procedure, unspecified as the cause of abnormal reaction of the patient, or of later complication, without mention of misadventure at the time of the procedure: Secondary | ICD-10-CM | POA: Insufficient documentation

## 2013-07-22 DIAGNOSIS — T888XXA Other specified complications of surgical and medical care, not elsewhere classified, initial encounter: Secondary | ICD-10-CM

## 2013-07-22 DIAGNOSIS — IMO0002 Reserved for concepts with insufficient information to code with codable children: Secondary | ICD-10-CM | POA: Insufficient documentation

## 2013-07-22 MED ORDER — HYDROCODONE-ACETAMINOPHEN 7.5-325 MG/15ML PO SOLN
15.0000 mL | Freq: Once | ORAL | Status: AC
  Administered 2013-07-22: 15 mL via ORAL
  Filled 2013-07-22: qty 15

## 2013-07-22 NOTE — ED Provider Notes (Signed)
CSN: [id number]     Arrival date & time 07/22/13  0551 History   First MD Initiated Contact with Patient 07/22/13 0559     Chief Complaint  Patient presents with  . Hemoptysis   (Consider location/radiation/quality/duration/timing/severity/associated sxs/prior Treatment) HPI 27 yo male presents to the ER with complaint of bleeding tonsil.  Pt had his tonsils removed last evening around 6 pm.  He woke just prior to arrival with "a mouthful of blood".  He reports severe pain to back of throat.  He was unable to fill his pain medication prescription last night, but is planning on filling it this morning.  Bleeding has slowed since spitting out the initial amount.  No difficulties breathing.  Pain with swallowing.  No past medical history on file. Past Surgical History  Procedure Laterality Date  . Exploratory laparotomy    . Tonsillectomy     No family history on file. History  Substance Use Topics  . Smoking status: Passive Smoke Exposure - Never Smoker    Types: Cigarettes  . Smokeless tobacco: Not on file  . Alcohol Use: No    Review of Systems  All other systems reviewed and are negative.    Allergies  Review of patient's allergies indicates no known allergies.  Home Medications   Current Outpatient Rx  Name  Route  Sig  Dispense  Refill  . phenol (CHLORASEPTIC) 1.4 % LIQD   Mouth/Throat   Use as directed 1 spray in the mouth or throat as needed (for sore throat).          BP 118/84  Pulse 77  Temp(Src) 97.3 F (36.3 C) (Oral)  Resp 22  SpO2 97% Physical Exam  Nursing note and vitals reviewed. Constitutional: He appears distressed.  HENT:  Head: Normocephalic and atraumatic.  Right Ear: External ear normal.  Left Ear: External ear normal.  Post operative changes to posterior pharynx.  Bright red blood noted, but no active bleeding  Eyes: Conjunctivae and EOM are normal. Pupils are equal, round, and reactive to light.  Neck: Normal range of motion. Neck  supple. No JVD present. No tracheal deviation present. No thyromegaly present.  Cardiovascular: Normal rate, regular rhythm, normal heart sounds and intact distal pulses.  Exam reveals no gallop and no friction rub.   No murmur heard. Pulmonary/Chest: Effort normal and breath sounds normal. No stridor. No respiratory distress. He has no wheezes. He has no rales. He exhibits no tenderness.  Lymphadenopathy:    He has no cervical adenopathy.  Skin: Skin is warm and dry. No rash noted. No erythema. No pallor.    ED Course  Procedures (including critical care time) Labs Review Labs Reviewed - No data to display Imaging Review Ct Soft Tissue Neck W Contrast  07/21/2013   CLINICAL DATA:  27 year old male with sore throat. Fever chills. Left peritonsillar abscess suspected.  EXAM: CT NECK WITH CONTRAST  TECHNIQUE: Multidetector CT imaging of the neck was performed using the standard protocol following the bolus administration of intravenous contrast.  CONTRAST:  75mL OMNIPAQUE IOHEXOL 300 MG/ML  SOLN  COMPARISON:  None.  FINDINGS: Negative lung apices and superior mediastinum. No osseous abnormality in the visible thorax. Small right greater than left C7 cervical ribs.  Thyroid, larynx, hypopharynx, sublingual space, submandibular glands, parotid glands, visualized brain parenchyma and orbits soft tissues are within normal limits.  Major vascular structures in the neck and at the skullbase remain patent, including the internal jugular veins.  Enlarged and hyperenhancing tonsillar pillars  and adenoids. The left tonsil is larger, and situated within the left tonsil there is a 14 x 15 x 16 mm hypodense area with earlier rim enhancement (series 2, image 38 and coronal image 40). The parapharyngeal spaces are normal. Evidence of trace fluid trapped within the hypertrophied adenoids. There is retropharyngeal effusion on the left which tracks caudally to the level of the left piriform sinus. Reactive cervical  lymph nodes, the largest are on the left at level 2 measuring up to 16 mm in short axis. Reactive retropharyngeal nodes measuring up to 10 mm short axis. Mild hypertrophy of the lingual tonsil.  Fluid levels and bubbly opacity in the left sphenoid and right maxillary sinuses. Minor paranasal sinus mucosal thickening elsewhere. Mastoids and tympanic cavities are clear. No acute osseous abnormality identified.  IMPRESSION: 1. Acute tonsillitis with left tonsillar abscess measuring 15 mm diameter (series 2, image 38). Reactive retropharyngeal effusion on the left tracking from the tonsil to the piriform sinus.  2. Reactive retropharyngeal and cervical lymphadenopathy, maximal at the left level 2 station.  3. Paranasal sinus inflammatory changes compatible with acute sinusitis.   Electronically Signed   By: Augusto Gamble M.D.   On: 07/21/2013 13:26    EKG Interpretation   None       MDM   1. Post-tonsillectomy hemorrhage, initial encounter    27 yo male with post-operative tonsilar bleeding, now resolved.  Will d/c pt's ENT physician.    Olivia Mackie, MD 07/22/13 [phone]

## 2013-07-22 NOTE — ED Notes (Signed)
Pt had tonsils removed Thursday at 5pm.  Pt awoke tonight with mouth full of blood.  Pt told to come here for evaluation by doctor.

## 2013-07-22 NOTE — Discharge Summary (Signed)
07/22/2013  7:26 AM  Date of Admission: 07/21/13 Date of Discharge:07/21/13  Discharge ZO:XWRU, Clovis Riley, MD  Admitting EA:VWUJ, Clovis Riley, MD  Reason for admission/final discharge diagnosis: left peritonsillar abscess, chronic tonsillitis  Labs:see EPIC  Procedure(s) performed:bilateral tonsillectomy 07/21/13  Discharge Condition:good  Discharge Exam:oral cavity hemostatic, tonsils surgically absent  Discharge Instructions: No heavy lifting, follow up with  Dr. Emeline Darling At [facility] ENT in 3-4 weeks or sooner if needed  Hospital Course: did well post-op after tonsillectomy with no bleeding from mouth and taking PO. Rx for prednisolone, zofran, clindamycin, and roxicet on chart. Patient presented to ER morning of 07/22/13 with a self-limited bleed from mouth that resolved with gargling with ice water. ER physician instructed patient to follow up next week, I advised to have patient only take liquid diet for 72 hours than advance to soft diet. Can use ice water gargles as needed for self-limited bleed, present to ER for refractory bleeding.  Melvenia Beam 7:26 AM 07/22/2013

## 2013-07-24 LAB — CULTURE, ROUTINE-ABSCESS

## 2013-07-26 LAB — ANAEROBIC CULTURE

## 2013-07-26 NOTE — ED Provider Notes (Signed)
Medical screening examination/treatment/procedure(s) were conducted as a shared visit with non-physician practitioner(s) and myself.  I personally evaluated the patient during the encounter.  EKG Interpretation   None       [name] Harris is a 27 y.o. male here with fever and chills and sore throat. Subjective fevers at home. No cough. L tonsil more swollen than right side and there are exudates on tonsils. There is cervical LAD as well. I was concerned that it was early PTA so CT ordered that showed L tonsillar abscess. Patient given clindamycin. I called Dr. Emeline Darling, will wants patient to come to his office immediately and he will perform and I&D in the office.     Richardean Canal, MD 07/26/13 2118

## 2013-10-06 ENCOUNTER — Emergency Department (INDEPENDENT_AMBULATORY_CARE_PROVIDER_SITE_OTHER)
Admission: EM | Admit: 2013-10-06 | Discharge: 2013-10-06 | Disposition: A | Discharge status: Home / Self Care | Payer: Self-pay | Source: Home / Self Care | Attending: Emergency Medicine | Admitting: Emergency Medicine

## 2013-10-06 ENCOUNTER — Other Ambulatory Visit (HOSPITAL_COMMUNITY)
Admission: RE | Admit: 2013-10-06 | Discharge: 2013-10-06 | Disposition: A | Discharge status: Home / Self Care | Payer: Self-pay | Source: Ambulatory Visit | Attending: Emergency Medicine | Admitting: Emergency Medicine

## 2013-10-06 ENCOUNTER — Encounter (HOSPITAL_COMMUNITY): Payer: Self-pay | Admitting: Emergency Medicine

## 2013-10-06 DIAGNOSIS — N342 Other urethritis: Secondary | ICD-10-CM

## 2013-10-06 DIAGNOSIS — Z113 Encounter for screening for infections with a predominantly sexual mode of transmission: Secondary | ICD-10-CM | POA: Insufficient documentation

## 2013-10-06 DIAGNOSIS — Z9189 Other specified personal risk factors, not elsewhere classified: Secondary | ICD-10-CM

## 2013-10-06 DIAGNOSIS — Z202 Contact with and (suspected) exposure to infections with a predominantly sexual mode of transmission: Secondary | ICD-10-CM

## 2013-10-06 LAB — POCT URINALYSIS DIP (DEVICE)
Bilirubin Urine: NEGATIVE
Glucose, UA: NEGATIVE mg/dL
Hgb urine dipstick: NEGATIVE
Ketones, ur: NEGATIVE mg/dL
Leukocytes, UA: NEGATIVE
Nitrite: NEGATIVE
PH: 7 (ref 5.0–8.0)
PROTEIN: NEGATIVE mg/dL
SPECIFIC GRAVITY, URINE: 1.02 (ref 1.005–1.030)
UROBILINOGEN UA: 0.2 mg/dL (ref 0.0–1.0)

## 2013-10-06 LAB — HIV ANTIBODY (ROUTINE TESTING W REFLEX): HIV: NONREACTIVE

## 2013-10-06 LAB — RPR: RPR Ser Ql: NONREACTIVE

## 2013-10-06 MED ORDER — LIDOCAINE HCL (PF) 1 % IJ SOLN
INTRAMUSCULAR | Status: AC
  Filled 2013-10-06: qty 5

## 2013-10-06 MED ORDER — AZITHROMYCIN 250 MG PO TABS
ORAL_TABLET | ORAL | Status: AC
  Filled 2013-10-06: qty 1

## 2013-10-06 MED ORDER — AZITHROMYCIN 250 MG PO TABS
1000.0000 mg | ORAL_TABLET | Freq: Every day | ORAL | Status: DC
  Administered 2013-10-06: 1000 mg via ORAL

## 2013-10-06 MED ORDER — CEFTRIAXONE SODIUM 250 MG IJ SOLR
INTRAMUSCULAR | Status: AC
  Filled 2013-10-06: qty 250

## 2013-10-06 MED ORDER — CEFTRIAXONE SODIUM 250 MG IJ SOLR
250.0000 mg | Freq: Once | INTRAMUSCULAR | Status: AC
  Administered 2013-10-06: 250 mg via INTRAMUSCULAR

## 2013-10-06 NOTE — ED Provider Notes (Signed)
Medical screening examination/treatment/procedure(s) were performed by non-physician practitioner and as supervising physician I was immediately available for consultation/collaboration.  Mar Walmer, M.D.  Jasmyne Lodato C Owain Eckerman, MD 10/06/13 1535 

## 2013-10-06 NOTE — ED Provider Notes (Addendum)
CSN: [id number]     Arrival date & time 10/06/13  [id number] History   First MD Initiated Contact with Patient 10/06/13 [phone]     Chief Complaint  Patient presents with  . Penile Discharge   (Consider location/radiation/quality/duration/timing/severity/associated sxs/prior Treatment) HPI Comments: 28 year old male presents complaining of penile discharge. About one week ago, he had unprotected intercourse with a male. 2 days ago, he started to experience clear penile discharge that has become slightly discolored. He denies dysuria, testicle pain, abdominal pain. No other symptoms  Patient is a 28 y.o. male presenting with penile discharge.  Penile Discharge Pertinent negatives include no chest pain, no abdominal pain and no shortness of breath.    History reviewed. No pertinent past medical history. Past Surgical History  Procedure Laterality Date  . Exploratory laparotomy    . Tonsillectomy    . Tonsillectomy Bilateral 07/21/2013    Procedure: TONSILLECTOMY;  Surgeon: Melvenia BeamMitchell Gore, MD;  [facility];  Service: ENT;  Laterality: Bilateral;   History reviewed. No pertinent family history. History  Substance Use Topics  . Smoking status: Current Every Day Smoker    Types: Cigarettes  . Smokeless tobacco: Not on file  . Alcohol Use: No    Review of Systems  Constitutional: Negative for fever, chills and fatigue.  HENT: Negative for sore throat.   Eyes: Negative for visual disturbance.  Respiratory: Negative for cough and shortness of breath.   Cardiovascular: Negative for chest pain, palpitations and leg swelling.  Gastrointestinal: Negative for nausea, vomiting, abdominal pain, diarrhea and constipation.  Genitourinary: Positive for discharge. Negative for dysuria, urgency, frequency and hematuria.  Musculoskeletal: Negative for arthralgias, myalgias, neck pain and neck stiffness.  Skin: Negative for rash.  Neurological: Negative for dizziness, weakness and light-headedness.     Allergies  Review of patient's allergies indicates no known allergies.  Home Medications   Current Outpatient Rx  Name  Route  Sig  Dispense  Refill  . phenol (CHLORASEPTIC) 1.4 % LIQD   Mouth/Throat   Use as directed 1 spray in the mouth or throat as needed (for sore throat).          BP 138/74  Pulse 63  Temp(Src) 98.9 F (37.2 C) (Oral)  Resp 18  SpO2 100% Physical Exam  Nursing note and vitals reviewed. Constitutional: He is oriented to person, place, and time. He appears well-developed and well-nourished. No distress.  HENT:  Head: Normocephalic.  Pulmonary/Chest: Effort normal. No respiratory distress.  Genitourinary: Testes normal. Right testis shows no mass, no swelling and no tenderness. Left testis shows no mass, no swelling and no tenderness. Discharge (clear) found.  No lesions  Lymphadenopathy:       Right: Inguinal adenopathy present.       Left: Inguinal adenopathy present.  nontender  Neurological: He is alert and oriented to person, place, and time. Coordination normal.  Skin: Skin is warm and dry. No rash noted. He is not diaphoretic.  Psychiatric: He has a normal mood and affect. Judgment normal.    ED Course  Procedures (including critical care time) Labs Review Labs Reviewed  RPR  HIV ANTIBODY (ROUTINE TESTING)  POCT URINALYSIS DIP (DEVICE)  URINE CYTOLOGY ANCILLARY ONLY   Imaging Review No results found.    MDM   1. Urethritis   2. Possible exposure to STD    Treated with rocephin and azithromycin.  F/u 2 months to test for cure.  Urine cytology sent, HIV and RPR sent as well   Meds  ordered this encounter  Medications  . cefTRIAXone (ROCEPHIN) injection 250 mg    Sig:   . DISCONTD: azithromycin (ZITHROMAX) tablet 1,000 mg    Sig:     Graylon Good, PA-C 10/06/13 0957   Labs came back with Gc and Ch.  Treated adequately.    Graylon Good, PA-C 10/07/13 1701

## 2013-10-06 NOTE — Discharge Instructions (Signed)
Sexually Transmitted Disease °Sexually transmitted disease (STD) refers to any infection that is passed from person to person during sexual activity. This may happen by way of saliva, semen, blood, vaginal mucus, or urine. Common STDs include: °· Gonorrhea. °· Chlamydia. °· Syphilis. °· HIV/AIDS. °· Genital herpes. °· Hepatitis B and C. °· Trichomonas. °· Human papillomavirus (HPV). °· Pubic lice. °CAUSES  °An STD may be spread by bacteria, virus, or parasite. A person can get an STD by: °· Sexual intercourse with an infected person. °· Sharing sex toys with an infected person. °· Sharing needles with an infected person. °· Having intimate contact with the genitals, mouth, or rectal areas of an infected person. °SYMPTOMS  °Some people may not have any symptoms, but they can still pass the infection to others. Different STDs have different symptoms. Symptoms include: °· Painful or bloody urination. °· Pain in the pelvis, abdomen, vagina, anus, throat, or eyes. °· Skin rash, itching, irritation, growths, or sores (lesions). These usually occur in the genital or anal area. °· Abnormal vaginal discharge. °· Penile discharge in men. °· Soft, flesh-colored skin growths in the genital or anal area. °· Fever. °· Pain or bleeding during sexual intercourse. °· Swollen glands in the groin area. °· Yellow skin and eyes (jaundice). This is seen with hepatitis. °DIAGNOSIS  °To make a diagnosis, your caregiver may: °· Take a medical history. °· Perform a physical exam. °· Take a specimen (culture) to be examined. °· Examine a sample of discharge under a microscope. °· Perform blood tests. °· Perform a Pap test, if this applies. °· Perform a colposcopy. °· Perform a laparoscopy. °TREATMENT  °· Chlamydia, gonorrhea, trichomonas, and syphilis can be cured with antibiotic medicine. °· Genital herpes, hepatitis, and HIV can be treated, but not cured, with prescribed medicines. The medicines will lessen the symptoms. °· Genital warts  from HPV can be treated with medicine or by freezing, burning (electrocautery), or surgery. Warts may come back. °· HPV is a virus and cannot be cured with medicine or surgery. However, abnormal areas may be followed very closely by your caregiver and may be removed from the cervix, vagina, or vulva through office procedures or surgery. °If your diagnosis is confirmed, your recent sexual partners need treatment. This is true even if they are symptom-free or have a negative culture or evaluation. They should not have sex until their caregiver says it is okay. °HOME CARE INSTRUCTIONS °· All sexual partners should be informed, tested, and treated for all STDs. °· Take your antibiotics as directed. Finish them even if you start to feel better. °· Only take over-the-counter or prescription medicines for pain, discomfort, or fever as directed by your caregiver. °· Rest. °· Eat a balanced diet and drink enough fluids to keep your urine clear or pale yellow. °· Do not have sex until treatment is completed and you have followed up with your caregiver. STDs should be checked after treatment. °· Keep all follow-up appointments, Pap tests, and blood tests as directed by your caregiver. °· Only use latex condoms and water-soluble lubricants during sexual activity. Do not use petroleum jelly or oils. °· Avoid alcohol and illegal drugs. °· Get vaccinated for HPV and hepatitis. If you have not received these vaccines in the past, talk to your caregiver about whether one or both might be right for you. °· Avoid risky sex practices that can break the skin. °The only way to avoid getting an STD is to avoid all sexual activity. Latex condoms and dental   dams (for oral sex) will help lessen the risk of getting an STD, but will not completely eliminate the risk. °SEEK MEDICAL CARE IF:  °· You have a fever. °· You have any new or worsening symptoms. °Document Released: 12/06/2002 Document Revised: 12/08/2011 Document Reviewed:  04/05/2013 °[employer]® Patient Information ©2014 [employer]. ° °

## 2013-10-06 NOTE — ED Notes (Signed)
C/o milky-colored discharge from penis that he noticed two days ago and six days after having sex with partner. Stated that the discharge was clear at first and then became milky. Denies any other sx or pain with urination. Written by: Marga MelnickQuaNeisha Jones, SMA

## 2013-10-06 NOTE — ED Notes (Signed)
Call back number verified.  

## 2013-10-07 NOTE — ED Provider Notes (Signed)
Medical screening examination/treatment/procedure(s) were performed by non-physician practitioner and as supervising physician I was immediately available for consultation/collaboration.  Tinsleigh Slovacek, M.D.  Pruitt Taboada C Giani Betzold, MD 10/07/13 1748 

## 2013-10-11 ENCOUNTER — Telehealth (HOSPITAL_COMMUNITY): Payer: Self-pay | Admitting: *Deleted

## 2013-10-11 ENCOUNTER — Encounter (HOSPITAL_COMMUNITY): Payer: Self-pay | Admitting: Emergency Medicine

## 2013-10-11 ENCOUNTER — Emergency Department (HOSPITAL_COMMUNITY): Payer: No Typology Code available for payment source

## 2013-10-11 ENCOUNTER — Emergency Department (HOSPITAL_COMMUNITY)
Admission: EM | Admit: 2013-10-11 | Discharge: 2013-10-11 | Disposition: A | Discharge status: Home / Self Care | Payer: No Typology Code available for payment source | Attending: Emergency Medicine | Admitting: Emergency Medicine

## 2013-10-11 DIAGNOSIS — F17200 Nicotine dependence, unspecified, uncomplicated: Secondary | ICD-10-CM | POA: Insufficient documentation

## 2013-10-11 DIAGNOSIS — Y92410 Unspecified street and highway as the place of occurrence of the external cause: Secondary | ICD-10-CM | POA: Insufficient documentation

## 2013-10-11 DIAGNOSIS — S0181XA Laceration without foreign body of other part of head, initial encounter: Secondary | ICD-10-CM

## 2013-10-11 DIAGNOSIS — Y939 Activity, unspecified: Secondary | ICD-10-CM | POA: Insufficient documentation

## 2013-10-11 DIAGNOSIS — IMO0002 Reserved for concepts with insufficient information to code with codable children: Secondary | ICD-10-CM | POA: Insufficient documentation

## 2013-10-11 DIAGNOSIS — T07XXXA Unspecified multiple injuries, initial encounter: Secondary | ICD-10-CM | POA: Insufficient documentation

## 2013-10-11 DIAGNOSIS — S01501A Unspecified open wound of lip, initial encounter: Secondary | ICD-10-CM | POA: Insufficient documentation

## 2013-10-11 DIAGNOSIS — S0180XA Unspecified open wound of other part of head, initial encounter: Secondary | ICD-10-CM | POA: Insufficient documentation

## 2013-10-11 DIAGNOSIS — R413 Other amnesia: Secondary | ICD-10-CM | POA: Insufficient documentation

## 2013-10-11 DIAGNOSIS — S01511A Laceration without foreign body of lip, initial encounter: Secondary | ICD-10-CM

## 2013-10-11 LAB — POCT I-STAT, CHEM 8
BUN: 10 mg/dL (ref 6–23)
CALCIUM ION: 1.2 mmol/L (ref 1.12–1.23)
Chloride: 102 mEq/L (ref 96–112)
Creatinine, Ser: 1.1 mg/dL (ref 0.50–1.35)
GLUCOSE: 146 mg/dL — AB (ref 70–99)
HEMATOCRIT: 43 % (ref 39.0–52.0)
Hemoglobin: 14.6 g/dL (ref 13.0–17.0)
Potassium: 3.2 mEq/L — ABNORMAL LOW (ref 3.7–5.3)
Sodium: 140 mEq/L (ref 137–147)
TCO2: 23 mmol/L (ref 0–100)

## 2013-10-11 LAB — CBC WITH DIFFERENTIAL/PLATELET
BASOS PCT: 1 % (ref 0–1)
Basophils Absolute: 0.1 10*3/uL (ref 0.0–0.1)
Eosinophils Absolute: 0.5 10*3/uL (ref 0.0–0.7)
Eosinophils Relative: 6 % — ABNORMAL HIGH (ref 0–5)
HEMATOCRIT: 39 % (ref 39.0–52.0)
HEMOGLOBIN: 13.8 g/dL (ref 13.0–17.0)
Lymphocytes Relative: 54 % — ABNORMAL HIGH (ref 12–46)
Lymphs Abs: 4.7 10*3/uL — ABNORMAL HIGH (ref 0.7–4.0)
MCH: 31.2 pg (ref 26.0–34.0)
MCHC: 35.4 g/dL (ref 30.0–36.0)
MCV: 88 fL (ref 78.0–100.0)
MONO ABS: 0.4 10*3/uL (ref 0.1–1.0)
Monocytes Relative: 5 % (ref 3–12)
NEUTROS ABS: 2.9 10*3/uL (ref 1.7–7.7)
Neutrophils Relative %: 34 % — ABNORMAL LOW (ref 43–77)
Platelets: 272 10*3/uL (ref 150–400)
RBC: 4.43 MIL/uL (ref 4.22–5.81)
RDW: 13.4 % (ref 11.5–15.5)
WBC: 8.6 10*3/uL (ref 4.0–10.5)

## 2013-10-11 LAB — ETHANOL: Alcohol, Ethyl (B): 62 mg/dL — ABNORMAL HIGH (ref 0–11)

## 2013-10-11 MED ORDER — CEFAZOLIN SODIUM 1-5 GM-% IV SOLN
1.0000 g | Freq: Once | INTRAVENOUS | Status: AC
  Administered 2013-10-11: 1 g via INTRAVENOUS
  Filled 2013-10-11: qty 50

## 2013-10-11 MED ORDER — ONDANSETRON HCL 4 MG/2ML IJ SOLN
4.0000 mg | Freq: Once | INTRAMUSCULAR | Status: AC
  Administered 2013-10-11: 4 mg via INTRAVENOUS
  Filled 2013-10-11: qty 2

## 2013-10-11 MED ORDER — FENTANYL CITRATE 0.05 MG/ML IJ SOLN
100.0000 ug | Freq: Once | INTRAMUSCULAR | Status: DC
  Filled 2013-10-11: qty 2

## 2013-10-11 MED ORDER — SODIUM CHLORIDE 0.9 % IV SOLN
INTRAVENOUS | Status: DC
  Administered 2013-10-11: 01:00:00 via INTRAVENOUS

## 2013-10-11 MED ORDER — FENTANYL CITRATE 0.05 MG/ML IJ SOLN
100.0000 ug | Freq: Once | INTRAMUSCULAR | Status: AC
  Administered 2013-10-11: 100 ug via INTRAVENOUS
  Filled 2013-10-11: qty 2

## 2013-10-11 MED ORDER — CEPHALEXIN 500 MG PO CAPS: 500.0000 mg | ORAL_CAPSULE | Freq: Four times a day (QID) | ORAL | Status: AC

## 2013-10-11 MED ORDER — HYDROCODONE-ACETAMINOPHEN 5-325 MG PO TABS: 1.0000 | ORAL_TABLET | ORAL | Status: AC | PRN

## 2013-10-11 NOTE — ED Notes (Addendum)
GC and Chlamydia pos., Trich neg., HIV/RPR non-reactive.  Pt. adequately treated with Rocephin and Zithromax. I called pt. and left a message to call.  Call 1. [employer]HS forms completed x 2 and [facility]. [name] Harris, [name] Harris M 10/11/2013 Unable to reach pt. by phone x 3.  Letter sent with results and instructions. 10/14/2013

## 2013-10-11 NOTE — ED Notes (Signed)
This RN dressed the pt's wounds with non-adherent gauze and kerlex. This RN also taught the pt's significant other how to place the dressings on the pt.
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
## 2013-10-11 NOTE — ED Notes (Signed)
Humes, PA at bedside.  

## 2013-10-11 NOTE — ED Provider Notes (Signed)
LACERATION REPAIR Performed by: Antony MaduraHUMES, Polina Burmaster Authorized by: Antony MaduraHUMES, Liandro Thelin Consent: Verbal consent obtained. Risks and benefits: risks, benefits and alternatives were discussed Consent given by: patient Patient identity confirmed: provided demographic data Prepped and Draped in normal sterile fashion Wound explored  Laceration Location: L forehead  Laceration Length: 6.5 cm  No Foreign Bodies seen or palpated  Anesthesia: local infiltration  Local anesthetic: lidocaine 2% without epinephrine  Anesthetic total: 8 ml  Irrigation method: syringe Amount of cleaning: standard  Skin closure: 6-0 prolene  Number of sutures: 16  Technique: simple interrupted  Patient tolerance: Patient tolerated the procedure well with no immediate complications.  LACERATION REPAIR Performed by: Antony MaduraHUMES, Jailen Coward Authorized by: Antony MaduraHUMES, Ghazi Rumpf Consent: Verbal consent obtained. Risks and benefits: risks, benefits and alternatives were discussed Consent given by: patient Patient identity confirmed: provided demographic data Prepped and Draped in normal sterile fashion Wound explored  Laceration Location: L forehead  Laceration Length: 6.5cm  No Foreign Bodies seen or palpated  Anesthesia: local infiltration  Local anesthetic: lidocaine 2% without epinephrine  Anesthetic total: 8 ml  Irrigation method: syringe Amount of cleaning: standard  Skin closure: 4-0 chromic  Number of sutures: 2  Technique: simple interrupted  Patient tolerance: Patient tolerated the procedure well with no immediate complications.  LACERATION REPAIR Performed by: Antony MaduraHUMES, Enslie Sahota Authorized by: Antony MaduraHUMES, Layna Roeper Consent: Verbal consent obtained. Risks and benefits: risks, benefits and alternatives were discussed Consent given by: patient Patient identity confirmed: provided demographic data Prepped and Draped in normal sterile fashion Wound explored  Laceration Location: L face inferior to L nare/above upper  lip  Laceration Length: 3.5cm  No Foreign Bodies seen or palpated  Anesthesia: local infiltration  Local anesthetic: lidocaine 2% without epinephrine  Anesthetic total: 2 ml  Irrigation method: syringe Amount of cleaning: standard  Skin closure: 6-0 prolene  Number of sutures: 9  Technique: simple interrupted  Patient tolerance: Patient tolerated the procedure well with no immediate complications.  LACERATION REPAIR Performed by: Antony MaduraHUMES, Dhana Totton Authorized by: Antony MaduraHUMES, Kiyra Slaubaugh Consent: Verbal consent obtained. Risks and benefits: risks, benefits and alternatives were discussed Consent given by: patient Patient identity confirmed: provided demographic data Prepped and Draped in normal sterile fashion Wound explored  Laceration Location: L face inferior to L nare/above upper lip  Laceration Length: 3.5cm  No Foreign Bodies seen or palpated  Anesthesia: local infiltration  Local anesthetic: lidocaine 2% without epinephrine  Anesthetic total: 2 ml  Irrigation method: syringe Amount of cleaning: standard  Skin closure: 4-0 chromic  Number of sutures: 1  Technique: subcutaneous  Patient tolerance: Patient tolerated the procedure well with no immediate complications.   Antony MaduraKelly Katara Griner, [place] JerseyPA-C 10/11/13 [phone]
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
## 2013-10-21 ENCOUNTER — Emergency Department (INDEPENDENT_AMBULATORY_CARE_PROVIDER_SITE_OTHER)
Admission: EM | Admit: 2013-10-21 | Discharge: 2013-10-21 | Disposition: A | Discharge status: Home / Self Care | Payer: Self-pay | Source: Home / Self Care | Attending: Family Medicine | Admitting: Family Medicine

## 2013-10-21 ENCOUNTER — Encounter (HOSPITAL_COMMUNITY): Payer: Self-pay | Admitting: Emergency Medicine

## 2013-10-21 DIAGNOSIS — Z4802 Encounter for removal of sutures: Secondary | ICD-10-CM
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
## 2013-10-21 NOTE — ED Provider Notes (Signed)
CSN: [id number]     Arrival date & time 10/21/13  1732 History   First MD Initiated Contact with Patient 10/21/13 1831     Chief Complaint  Patient presents with  . Wound Check   (Consider location/radiation/quality/duration/timing/severity/associated sxs/prior Treatment) Patient is a 28 y.o. male presenting with wound check. The history is provided by the patient. No language interpreter was used.  Wound Check This is a new problem. The problem occurs constantly. The problem has not changed since onset.Pertinent negatives include no headaches. Nothing aggravates the symptoms. Nothing relieves the symptoms. He has tried nothing for the symptoms. The treatment provided no relief.   Pt here for suture removal.  No complaints History reviewed. No pertinent past medical history. Past Surgical History  Procedure Laterality Date  . Exploratory laparotomy    . Tonsillectomy    . Tonsillectomy Bilateral 07/21/2013    Procedure: TONSILLECTOMY;  Surgeon: Melvenia BeamMitchell Gore, MD;  [facility];  Service: ENT;  Laterality: Bilateral;   History reviewed. No pertinent family history. History  Substance Use Topics  . Smoking status: Current Every Day Smoker    Types: Cigarettes  . Smokeless tobacco: Not on file  . Alcohol Use: No    Review of Systems  Neurological: Negative for headaches.  All other systems reviewed and are negative.    Allergies  Review of patient's allergies indicates no known allergies.  Home Medications   Current Outpatient Rx  Name  Route  Sig  Dispense  Refill  . cephALEXin (KEFLEX) 500 MG capsule   Oral   Take 1 capsule (500 mg total) by mouth 4 (four) times daily.   20 capsule   0   . HYDROcodone-acetaminophen (NORCO) 5-325 MG per tablet   Oral   Take 1-2 tablets by mouth every 4 (four) hours as needed (for pain).   30 tablet   0    Pulse 70  Temp(Src) 98.5 F (36.9 C) (Oral)  Resp 10  SpO2 100% Physical Exam  Nursing note and vitals  reviewed. Constitutional: He is oriented to person, place, and time. He appears well-developed and well-nourished.  HENT:  Head: Normocephalic.  Right Ear: External ear normal.  Left Ear: External ear normal.  Healed laceration forehead and face  Eyes: Pupils are equal, round, and reactive to light.  Neck: Normal range of motion.  Cardiovascular: Normal rate and normal heart sounds.   Pulmonary/Chest: Effort normal and breath sounds normal.  Abdominal: Soft.  Neurological: He is alert and oriented to person, place, and time.  Skin: Skin is warm.    ED Course  Procedures (including critical care time) Labs Review Labs Reviewed - No data to display Imaging Review No results found.  EKG Interpretation    Date/Time:    Ventricular Rate:    PR Interval:    QRS Duration:   QT Interval:    QTC Calculation:   R Axis:     Text Interpretation:              MDM   1. Visit for suture removal       Elson AreasLeslie K Sofia, PA-C 10/21/13 1905

## 2013-10-21 NOTE — Discharge Instructions (Signed)
Suture Removal, Care After Refer to this sheet in the next few weeks. These instructions provide you with information on caring for yourself after your procedure. Your health care provider may also give you more specific instructions. Your treatment has been planned according to current medical practices, but problems sometimes occur. Call your health care provider if you have any problems or questions after your procedure. WHAT TO EXPECT AFTER THE PROCEDURE After your stitches (sutures) are removed, it is typical to have the following:  Some discomfort and swelling in the wound area.  Slight redness in the area. HOME CARE INSTRUCTIONS   If you have skin adhesive strips over the wound area, do not take the strips off. They will fall off on their own in a few days. If the strips remain in place after 14 days, you may remove them.  Change any bandages (dressings) at least once a day or as directed by your health care provider. If the bandage sticks, soak it off with warm, soapy water.  Apply cream or ointment only as directed by your health care provider. If using cream or ointment, wash the area with soap and water 2 times a day to remove all the cream or ointment. Rinse off the soap and pat the area dry with a clean towel.  Keep the wound area dry and clean. If the bandage becomes wet or dirty, or if it develops a bad smell, change it as soon as possible.  Continue to protect the wound from injury.  Use sunscreen when out in the sun. New scars become sunburned easily. SEEK MEDICAL CARE IF:  You have increasing redness, swelling, or pain in the wound.  You see pus coming from the wound.  You have a fever.  You notice a bad smell coming from the wound or dressing.  Your wound breaks open (edges not staying together). Document Released: 06/10/2001 Document Revised: 07/06/2013 Document Reviewed: 04/27/2013 [employer] Patient Information 2014 [employer].  

## 2013-10-22 NOTE — ED Provider Notes (Signed)
Medical screening examination/treatment/procedure(s) were performed by a resident physician or non-physician practitioner and as the supervising physician I was immediately available for consultation/collaboration.  Evan Corey, MD    Evan S Corey, MD 10/22/13 0842 
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
## 2017-03-10 ENCOUNTER — Emergency Department (HOSPITAL_COMMUNITY)
Admission: EM | Admit: 2017-03-10 | Discharge: 2017-03-10 | Disposition: A | Discharge status: Home / Self Care | Payer: Self-pay | Attending: Emergency Medicine | Admitting: Emergency Medicine

## 2017-03-10 ENCOUNTER — Encounter (HOSPITAL_COMMUNITY): Payer: Self-pay

## 2017-03-10 DIAGNOSIS — R369 Urethral discharge, unspecified: Secondary | ICD-10-CM | POA: Insufficient documentation

## 2017-03-10 DIAGNOSIS — Z87891 Personal history of nicotine dependence: Secondary | ICD-10-CM | POA: Insufficient documentation

## 2017-03-10 DIAGNOSIS — J45909 Unspecified asthma, uncomplicated: Secondary | ICD-10-CM | POA: Insufficient documentation

## 2017-03-10 DIAGNOSIS — N5089 Other specified disorders of the male genital organs: Secondary | ICD-10-CM | POA: Insufficient documentation

## 2017-03-10 DIAGNOSIS — N489 Disorder of penis, unspecified: Secondary | ICD-10-CM

## 2017-03-10 HISTORY — DX: Unspecified asthma, uncomplicated: J45.909

## 2017-03-10 MED ORDER — AZITHROMYCIN 250 MG PO TABS
1000.0000 mg | ORAL_TABLET | Freq: Once | ORAL | Status: AC
  Administered 2017-03-10: 1000 mg via ORAL
  Filled 2017-03-10: qty 4

## 2017-03-10 MED ORDER — CEFTRIAXONE SODIUM 250 MG IJ SOLR
250.0000 mg | Freq: Once | INTRAMUSCULAR | Status: AC
  Administered 2017-03-10: 250 mg via INTRAMUSCULAR
  Filled 2017-03-10: qty 250

## 2017-03-10 MED ORDER — METRONIDAZOLE 500 MG PO TABS: 500.0000 mg | ORAL_TABLET | Freq: Two times a day (BID) | ORAL | 0 refills | Status: AC

## 2017-03-10 NOTE — ED Provider Notes (Signed)
[facility]-EMERGENCY DEPT Provider Note    By signing my name below, I, Earmon Phoenix, attest that this documentation has been prepared under the direction and in the presence of [facility], [place]. Electronically Signed: Earmon Phoenix, ED Scribe. 03/10/17. 8:18 PM.    History   Chief Complaint Chief Complaint  Patient presents with  . Rash   HPI  [name] Harris is a 31 y.o. male who presents to the Emergency Department complaining of a rash to the head of the penis that began about two weeks ago. He reports associated mild itching. He states his girlfriend was diagnosed with gonorrhea about three weeks ago and he has not yet been evaluated. He has not done anything to treat the symptoms. There are no modifying factors noted. He denies fever, chills, abdominal pain, nausea, vomiting, dysuria, penile discharge, penile pain or swelling, testicular pain or swelling or any urinary complaints. He reports h/o gonorrhea.    Past Medical History:  Diagnosis Date  . Asthma     There are no active problems to display for this patient.   Past Surgical History:  Procedure Laterality Date  . EXPLORATORY LAPAROTOMY    . TONSILLECTOMY    . TONSILLECTOMY Bilateral 07/21/2013   Procedure: TONSILLECTOMY;  Surgeon: Melvenia Beam, MD;  Location: [facility] OR;  Service: ENT;  Laterality: Bilateral;       Home Medications    Prior to Admission medications   Medication Sig Start Date End Date Taking? Authorizing Provider  cephALEXin (KEFLEX) 500 MG capsule Take 1 capsule (500 mg total) by mouth 4 (four) times daily. 10/11/13   Molpus, John, MD  HYDROcodone-acetaminophen (NORCO) 5-325 MG per tablet Take 1-2 tablets by mouth every 4 (four) hours as needed (for pain). 10/11/13   Molpus, John, MD  metroNIDAZOLE (FLAGYL) 500 MG tablet Take 1 tablet (500 mg total) by mouth 2 (two) times daily. 03/10/17   Janne Napoleon, NP    Family History History reviewed. No pertinent family history.  Social  History Social History  Substance Use Topics  . Smoking status: Former Games developer  . Smokeless tobacco: Never Used  . Alcohol use Yes     Comment: 1 pint liquor a week      Allergies   Patient has no known allergies.   Review of Systems Review of Systems  Constitutional: Negative for chills and fever.  HENT: Negative.   Respiratory: Negative for cough.   Gastrointestinal: Negative for abdominal pain, nausea and vomiting.  Genitourinary: Positive for genital sores. Negative for discharge, dysuria, frequency, penile pain, penile swelling, scrotal swelling and testicular pain.  Musculoskeletal: Negative for back pain.  Skin: Positive for rash (on penis).  Neurological: Negative for headaches.  Hematological: Negative for adenopathy.  Psychiatric/Behavioral: The patient is not nervous/anxious.      Physical Exam Updated Vital Signs BP 132/75 (BP Location: Left Arm)   Pulse 60   Temp 99.3 F (37.4 C) (Oral)   Resp 18   Ht 5\' 10"  (1.778 m)   Wt 170 lb (77.1 kg)   SpO2 100%   BMI 24.39 kg/m   Physical Exam  Constitutional: He appears well-developed and well-nourished.  HENT:  Head: Normocephalic.  Eyes: EOM are normal.  Neck: Neck supple.  Cardiovascular: Normal rate.   Pulmonary/Chest: Effort normal.  Abdominal: Soft. There is no tenderness.  Genitourinary: Right testis shows no mass, no swelling and no tenderness. Left testis shows no mass, no swelling and no tenderness. Circumcised. No penile erythema or penile tenderness.  No discharge found.  Genitourinary Comments: There are three lesions to the head of the penis that are dried and healing.  Musculoskeletal: Normal range of motion.  Lymphadenopathy: No inguinal adenopathy noted on the right or left side.       Right: No inguinal adenopathy present.       Left: No inguinal adenopathy present.  Neurological: He is alert.  Skin: Skin is warm and dry.  Psychiatric: He has a normal mood and affect. His behavior is  normal.  Nursing note and vitals reviewed.    ED Treatments / Results  DIAGNOSTIC STUDIES: Oxygen Saturation is 100% on RA, normal by my interpretation.   COORDINATION OF CARE: 8:17 PM- Will check and treat for GC/chlamydia and order HIV and RPR testing. Pt verbalizes understanding and agrees to plan.  Medications  cefTRIAXone (ROCEPHIN) injection 250 mg (250 mg Intramuscular Given 03/10/17 2049)  azithromycin (ZITHROMAX) tablet 1,000 mg (1,000 mg Oral Given 03/10/17 2049)    Labs (all labs ordered are listed, but only abnormal results are displayed) Labs Reviewed  RPR  HIV ANTIBODY (ROUTINE TESTING)  GC/CHLAMYDIA PROBE AMP ([place]) NOT AT [facility]   Radiology No results found.  Procedures Procedures (including critical care time)  Medications Ordered in ED Medications  cefTRIAXone (ROCEPHIN) injection 250 mg (250 mg Intramuscular Given 03/10/17 2049)  azithromycin (ZITHROMAX) tablet 1,000 mg (1,000 mg Oral Given 03/10/17 2049)     Initial Impression / Assessment and Plan / ED Course  I have reviewed the triage vital signs and the nursing notes.  Patient treated in the ED for STI with Rocephin and Zithromax.  Pt advised on safe sex practices and understands that they have GC/Chlamydia cultures pending and will result in 2-3 days. HIV and RPR sent. Pt encouraged to follow up at local health department for future STI checks. No concern for prostatitis or epididymitis. Discussed return precautions. Pt appears safe for discharge.    Final Clinical Impressions(s) / ED Diagnoses   Final diagnoses:  Lesion of penis  Penile discharge    New Prescriptions Discharge Medication List as of 03/10/2017  9:03 PM    START taking these medications   Details  metroNIDAZOLE (FLAGYL) 500 MG tablet Take 1 tablet (500 mg total) by mouth 2 (two) times daily., Starting Tue 03/10/2017, Print      I personally performed the services described in this documentation, which was scribed in my  presence. The recorded information has been reviewed and is accurate.     Kerrie Buffaloeese, Edrei Norgaard [place], [place] 03/11/17 [id number]    Tegeler, Canary Brimhristopher J, MD 03/11/17 [phone]

## 2017-03-10 NOTE — ED Notes (Signed)
Pt states his girlfriend was contacted and informed of positive STD results and he believes he is now symptomatic.

## 2017-03-10 NOTE — ED Triage Notes (Signed)
Onset 3 weeks pt had intercourse with girlfriend who was being treated for gonorrhea, pt was not treated as he did not have any symptoms.  Onset 4-5 days ago redness noted to end of penis, no drainage, or penile discharge.  Mildly itchy.

## 2017-03-10 NOTE — Discharge Instructions (Signed)
We have given you medication to treat gonorrhea and chlamydia and prescription for antibiotic to treat trichomonas. I am not completely sure what the rash on the penis is. This could possibly be herpes that has now dried and resolving so I am giving you information regarding herpes. With a lesion we also test for syphilis. I would suggest that you follow up with the health department for additional testing. We will call you if your tests come back positive or you can look on " My Chart" for results

## 2017-03-11 LAB — HIV ANTIBODY (ROUTINE TESTING W REFLEX): HIV SCREEN 4TH GENERATION: NONREACTIVE

## 2017-03-11 LAB — RPR: RPR Ser Ql: NONREACTIVE

## 2017-03-11 LAB — CYTOLOGY, (ORAL, ANAL, URETHRAL) ANCILLARY ONLY
CHLAMYDIA, DNA PROBE: NEGATIVE
Neisseria Gonorrhea: NEGATIVE

## 2019-03-17 ENCOUNTER — Other Ambulatory Visit: Payer: Self-pay | Admitting: Hematology

## 2019-03-17 DIAGNOSIS — Z20822 Contact with and (suspected) exposure to covid-19: Secondary | ICD-10-CM

## 2019-03-17 NOTE — Progress Notes (Signed)
[place] population /

## 2019-03-18 ENCOUNTER — Other Ambulatory Visit: Payer: Self-pay

## 2019-03-18 DIAGNOSIS — Z20822 Contact with and (suspected) exposure to covid-19: Secondary | ICD-10-CM

## 2020-10-11 ENCOUNTER — Other Ambulatory Visit: Payer: Self-pay

## 2020-10-12 ENCOUNTER — Other Ambulatory Visit: Payer: Self-pay

## 2020-10-12 DIAGNOSIS — Z20822 Contact with and (suspected) exposure to covid-19: Secondary | ICD-10-CM

## 2020-10-14 LAB — SARS-COV-2, NAA 2 DAY TAT

## 2020-10-14 LAB — NOVEL CORONAVIRUS, NAA: SARS-CoV-2, NAA: DETECTED — AB

## 2020-10-15 ENCOUNTER — Ambulatory Visit: Payer: Self-pay | Admitting: *Deleted

## 2020-10-15 NOTE — Telephone Encounter (Signed)
Patient notified of positive COVID-19 test results. Pt verbalized understanding. Pt reports symptoms of Criteria for self-isolation if you test positive for COVID-19, regardless of vaccination status:  -If you have mild symptoms that are resolving or have resolved, isolate at home for 5 days since symptoms started AND continue to wear a well-fitted mask when around others in the home and in public for 5 additional days after isolation is completed -If you have a fever and/or moderate to severe symptoms, isolate for at least 10 days since the symptoms started AND until you are fever free for at least 24 hours without the use of fever-reducing medications -If you tested positive and did not have symptoms, isolate for at least 5 days after your positive test Lost of smell, a little dizziness like when I'm [place].  I feel a whole lot better.  I didn't know I had a covid.  It was so mild.    Use over-the-counter medications for symptoms.If you develop respiratory issues/distress, seek medical care in the Emergency Department.  If you must leave home or if you have to be around others please wear a mask. Please limit contact with immediate family members in the home, practice social distancing, frequent handwashing and clean hard surfaces touched frequently with household cleaning products. Members of your household will also need to quarantine and test.You may also be contacted by the health department for follow up.  [facility] Department notified.

## 2020-10-21 ENCOUNTER — Other Ambulatory Visit: Payer: Self-pay

## 2020-10-21 ENCOUNTER — Emergency Department (HOSPITAL_COMMUNITY): Admission: EM | Admit: 2020-10-21 | Discharge: 2020-10-22 | Discharge status: Against Medical Advice | Payer: Self-pay

## 2020-10-21 NOTE — ED Notes (Signed)
Pt is saying he wants to leave so I am giving him a minute to talk to his mother. She is trying to convince him tot stay.

## 2023-04-15 ENCOUNTER — Encounter (HOSPITAL_COMMUNITY): Payer: Self-pay

## 2023-04-15 ENCOUNTER — Ambulatory Visit (HOSPITAL_COMMUNITY)
Admission: EM | Admit: 2023-04-15 | Discharge: 2023-04-15 | Disposition: A | Discharge status: Home / Self Care | Payer: Self-pay | Attending: Family Medicine | Admitting: Family Medicine

## 2023-04-15 DIAGNOSIS — B356 Tinea cruris: Secondary | ICD-10-CM

## 2023-04-15 NOTE — ED Provider Notes (Signed)
  [facility]   [phone] 04/15/23 Arrival Time: 4782  ASSESSMENT & PLAN:  1. Tinea cruris      Discharge Instructions      Continue using your anti-fungal cream twice daily for the next two weeks.     Follow-up Information     [facility] at [facility].   Specialty: Urgent Care Why: If worsening or failing to improve as anticipated after using anti-fungal cream for the next 14 days. Contact information: [address] [place] [address] [phone]               No signs of bacterial infection.  Reviewed expectations re: course of current medical issues. Questions answered. Outlined signs and symptoms indicating need for more acute intervention. Patient verbalized understanding. After Visit Summary given.   SUBJECTIVE:  [name] Harris is a 37 y.o. male who presents with a skin complaint.  Here with itching on the head of his penis. Pt thinks he has jock itch.  Used anti-fungal cream for a few days; area cleared.   OBJECTIVE: Vitals:   04/15/23 1855  BP: 135/87  Pulse: 86  Resp: 16  Temp: 99.4 F (37.4 C)  TempSrc: Oral  SpO2: 98%    General appearance: alert; no distress Skin: warm and dry; head of penis with splotchy red rash with irregular borders; slightly raised; no ulcerations or wounds Psychological: alert and cooperative; normal mood and affect  No Known Allergies  Past Medical History:  Diagnosis Date   Asthma    Social History   Socioeconomic History   Marital status: Single    Spouse name: Not on file   Number of children: Not on file   Years of education: Not on file   Highest education level: Not on file  Occupational History   Not on file  Tobacco Use   Smoking status: Former   Smokeless tobacco: Never  Substance and Sexual Activity   Alcohol use: Yes    Comment: 1 pint liquor a week    Drug use: Yes    Types: Marijuana   Sexual activity: Yes    Birth  control/protection: Condom  Other Topics Concern   Not on file  Social History Narrative   Not on file   Social Determinants of Health   Financial Resource Strain: Not on file  Food Insecurity: Not on file  Transportation Needs: Not on file  Physical Activity: Not on file  Stress: Not on file  Social Connections: Not on file  Intimate Partner Violence: Not on file   History reviewed. No pertinent family history. Past Surgical History:  Procedure Laterality Date   EXPLORATORY LAPAROTOMY     TONSILLECTOMY     TONSILLECTOMY Bilateral 07/21/2013   Procedure: TONSILLECTOMY;  Surgeon: Melvenia Beam, MD;  Location: [facility] OR;  Service: ENT;  Laterality: Barbette Or, MD 04/15/23 2001

## 2023-04-15 NOTE — ED Triage Notes (Signed)
Here with itching on the head of his penis. Pt thinks he has jock itch.

## 2023-04-15 NOTE — Discharge Instructions (Signed)
Continue using your anti-fungal cream twice daily for the next two weeks.

## 2024-01-18 ENCOUNTER — Encounter (HOSPITAL_COMMUNITY): Payer: Self-pay
